# Patient Record
Sex: Male | Born: 2000 | Race: White | Hispanic: No | Marital: Single | Smoking: Never smoker
Health system: Southern US, Community
[De-identification: ages and names within clinical notes are randomized; demographics above are authoritative.]

---

## 2021-06-15 ENCOUNTER — Other Ambulatory Visit: Payer: Self-pay

## 2021-06-15 ENCOUNTER — Ambulatory Visit (INDEPENDENT_AMBULATORY_CARE_PROVIDER_SITE_OTHER): Payer: BC Managed Care – PPO | Admitting: Urology

## 2021-06-15 ENCOUNTER — Encounter: Payer: Self-pay | Admitting: Urology

## 2021-06-15 VITALS — BP 128/81 | HR 74 | Ht 73.0 in | Wt 155.0 lb

## 2021-06-15 DIAGNOSIS — N5089 Other specified disorders of the male genital organs: Secondary | ICD-10-CM

## 2021-06-15 NOTE — Progress Notes (Signed)
   06/15/2021 1:14 PM   Dylan Mcdaniel Mar 20, 2001 [id number]  Referring provider: No referring provider defined for this encounter.  Chief Complaint  Patient presents with   New Patient (Initial Visit)    Testicular cyst    HPI: Dylan Mcdaniel is a 20 y.o. male presents for evaluation of a scrotal mass.  Recently moved to the area from [place] and prior to leaving he palpated an abnormality of the left testis He was seen at urgent care and referred to a urologist in [place] A scrotal ultrasound was performed on 05/26/2021.  He showed me the report on his phone and the testes were normal in appearance and a small left spermatocele with this described He states he was not confident that this was the abnormality he was feeling and requested a second opinion No bothersome LUTS or scrotal pain   PMH: No past medical history on file.  Surgical History: None  Home Medications:  Allergies as of 06/15/2021   No Known Allergies      Medication List    as of June 15, 2021 11:59 PM   You have not been prescribed any medications.     Allergies: No Known Allergies  Family History: No family history on file.  Social History:  reports that he has never smoked. He has never used smokeless tobacco. He reports current alcohol use. He reports that he does not use drugs.   Physical Exam: BP 128/81   Pulse 74   Ht 6\' 1"  (1.854 m)   Wt 155 lb (70.3 kg)   BMI 20.45 kg/m   Constitutional:  Alert and oriented, No acute distress. HEENT: [place] AT, moist mucus membranes.  Trachea midline, no masses. Cardiovascular: No clubbing, cyanosis, or edema. Respiratory: Normal respiratory effort, no increased work of breathing. GU: Phallus without lesions, testes descended bilaterally, normal size.  On the superior aspect of the left testis is a smooth, flat area of increased firmness measuring ~ 3 mm.  Spermatic cord/epididymis palpably normal.  I do not appreciate a left spermatocele Skin: No  rashes, bruises or suspicious lesions. Neurologic: Grossly intact, no focal deficits, moving all 4 extremities. Psychiatric: Normal mood and affect.   Assessment & Plan:    1.  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

## 2021-06-21 ENCOUNTER — Ambulatory Visit
Admission: RE | Admit: 2021-06-21 | Discharge: 2021-06-21 | Disposition: A | Discharge status: Home / Self Care | Payer: BC Managed Care – PPO | Source: Ambulatory Visit | Attending: Urology | Admitting: Urology

## 2021-06-21 ENCOUNTER — Other Ambulatory Visit: Payer: Self-pay

## 2021-06-21 DIAGNOSIS — N5089 Other specified disorders of the male genital organs: Secondary | ICD-10-CM | POA: Insufficient documentation

## 2021-06-27 ENCOUNTER — Telehealth: Payer: Self-pay | Admitting: *Deleted

## 2021-06-27 NOTE — Telephone Encounter (Signed)
Notified patient as instructed, patient pleased. Discussed follow-up appointments, patient agrees  

## 2021-06-27 NOTE — Telephone Encounter (Signed)
-----   Message from Riki Altes, MD sent at 06/27/2021  7:32 AM EDT ----- Scrotal ultrasound showed no significant changes from his prior ultrasound that was done out of town.  They are calling the area of abnormality and epididymal cyst however on exam the palpable abnormality is on the testis.  Since no abnormalities of the testis noted this is benign.  I would recommend a follow-up exam in 2-3 months

## 2021-09-28 ENCOUNTER — Ambulatory Visit: Payer: BC Managed Care – PPO | Admitting: Urology

## 2021-09-28 ENCOUNTER — Encounter: Payer: Self-pay | Admitting: Urology

## 2022-08-05 IMAGING — US US SCROTUM W/ DOPPLER COMPLETE
1 series · 14 of 25 positions shown · non-contrast
Comparison: None.

CLINICAL DATA: Palpable abnormality over the left scrotum 2 months.

EXAM:
SCROTAL ULTRASOUND
DOPPLER ULTRASOUND OF THE TESTICLES
TECHNIQUE: Complete ultrasound examination of the testicles, epididymis, and
other scrotal structures was performed. Color and spectral Doppler
ultrasound were also utilized to evaluate blood flow to the
testicles.

[Series 1: us scrotum w/ doppler complete · 0.07mm/px · 14 of 68 slices shown]
[im 1/68]
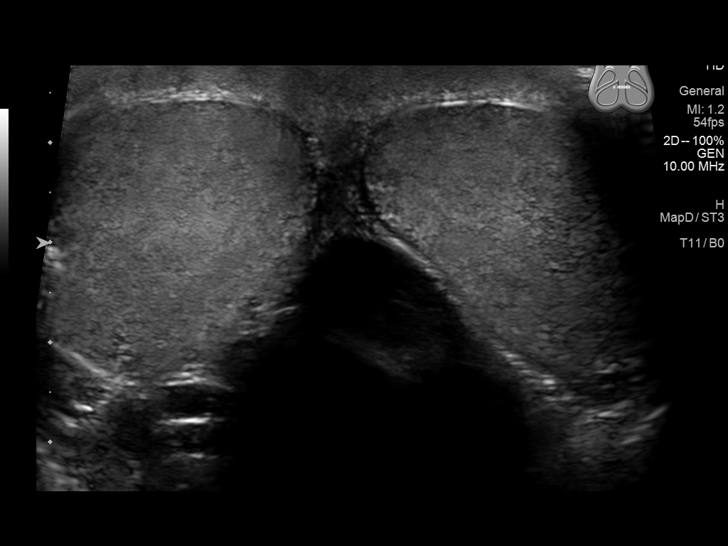
[im 6/68]
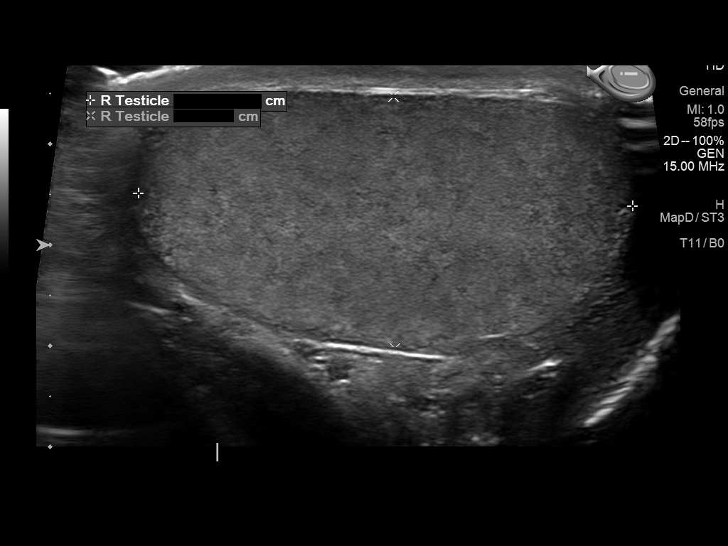
[im 12/68]
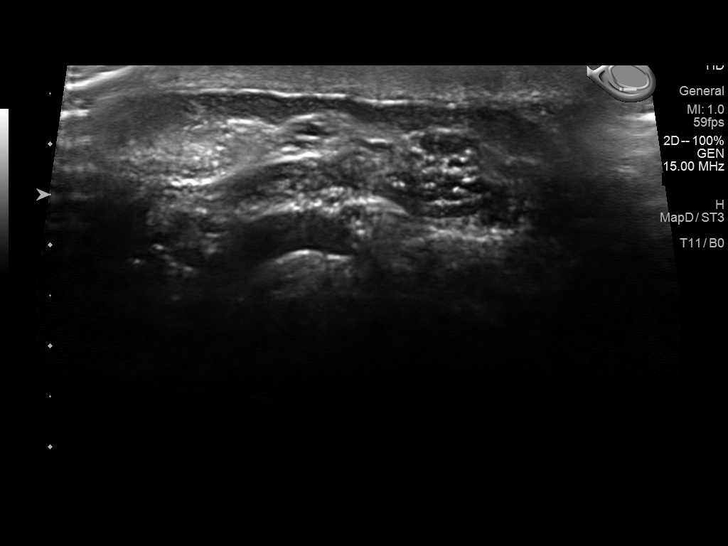
[im 17/68]
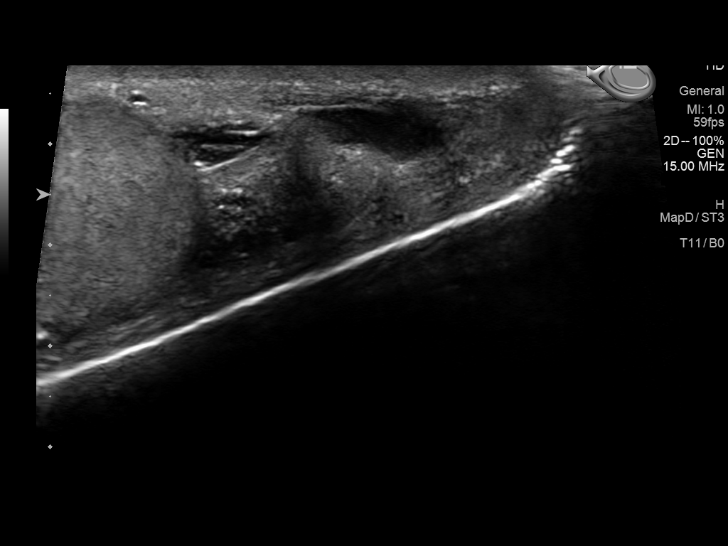
[im 23/68]
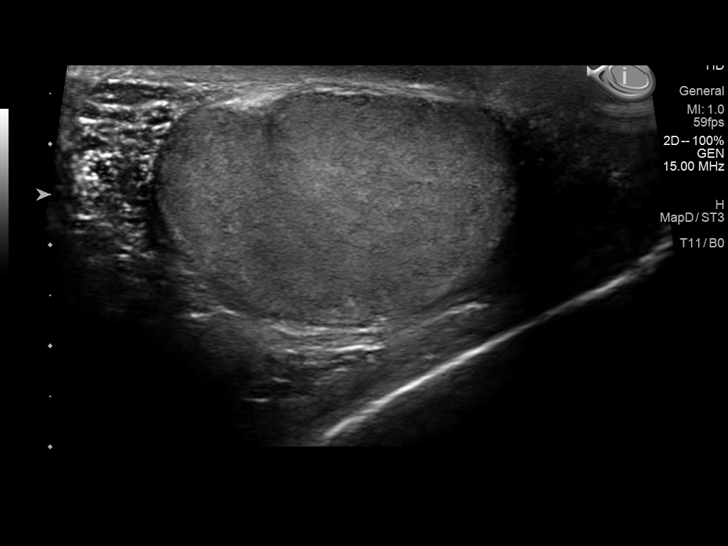
[im 26/68]
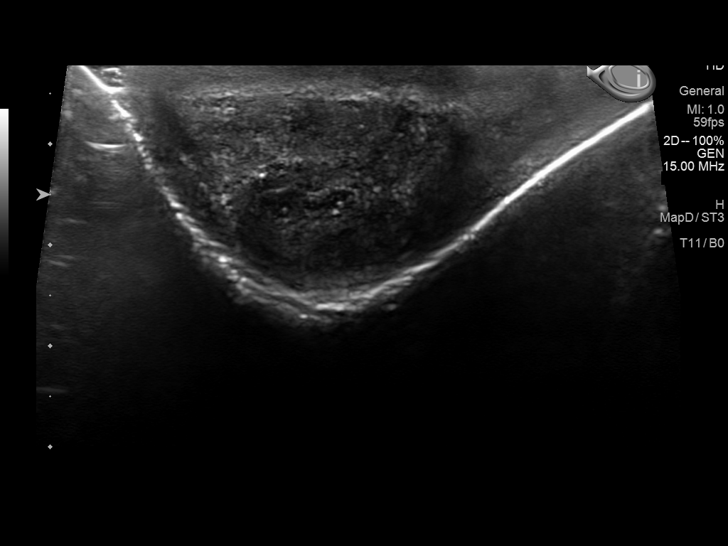
[im 31/68]
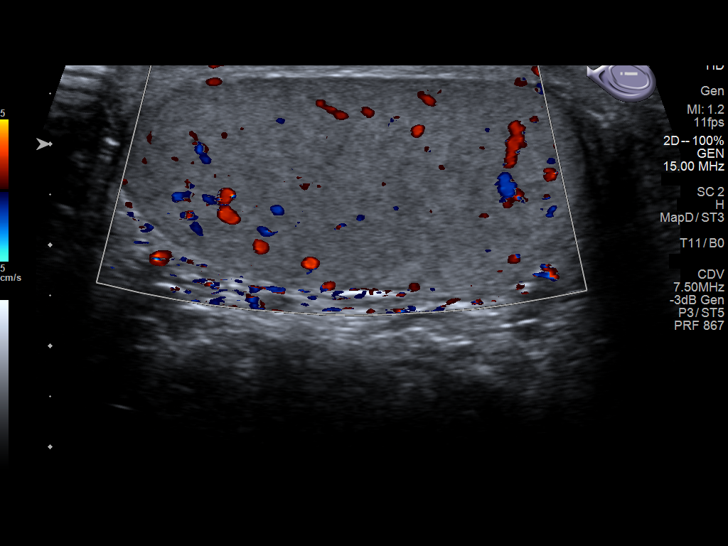
[im 37/68]
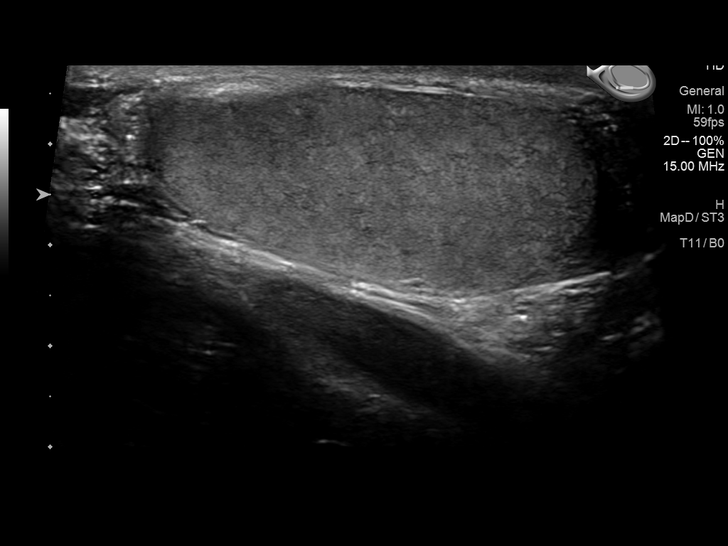
[im 42/68]
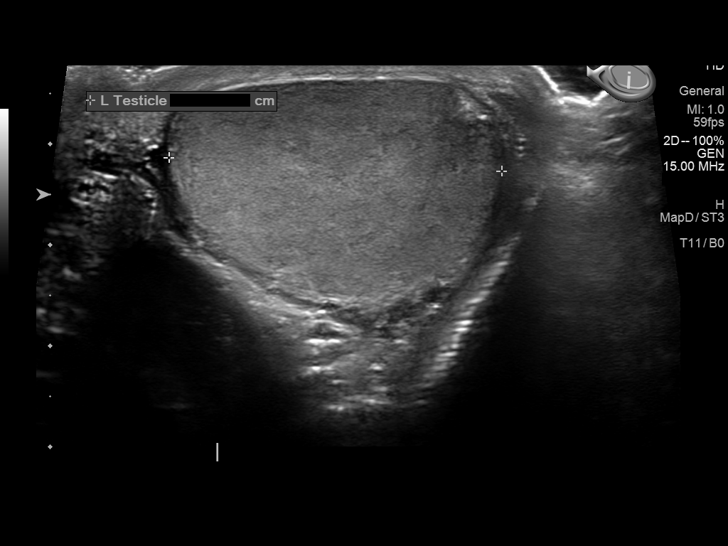
[im 45/68]
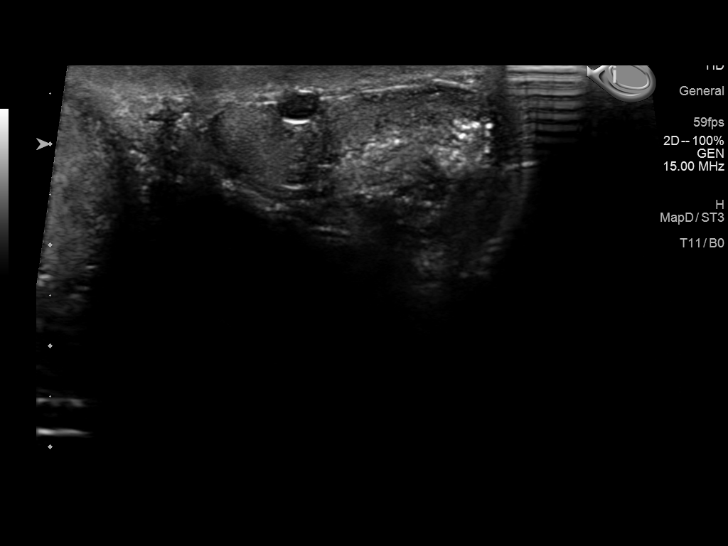
[im 51/68]
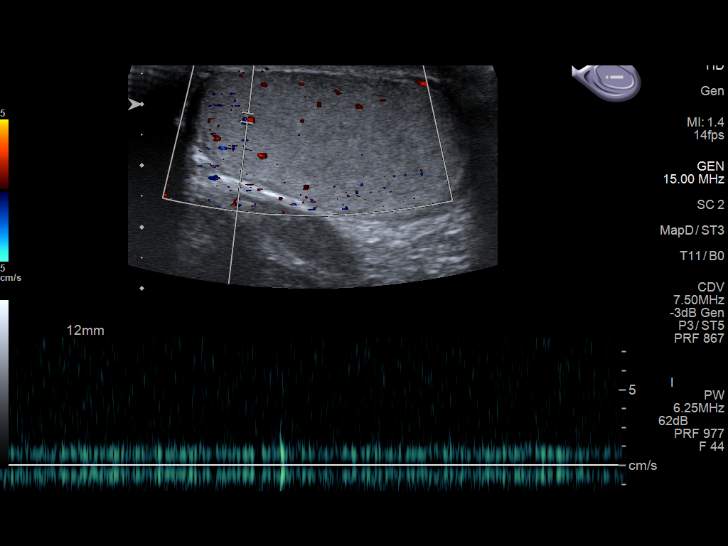
[im 56/68]
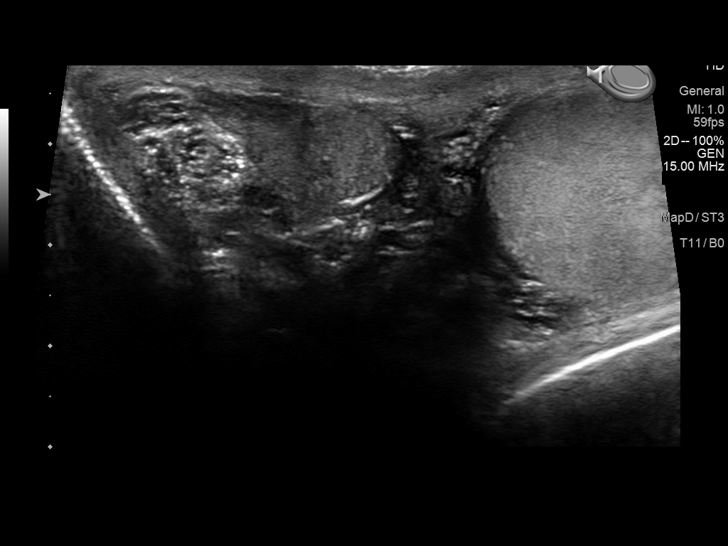
[im 62/68]
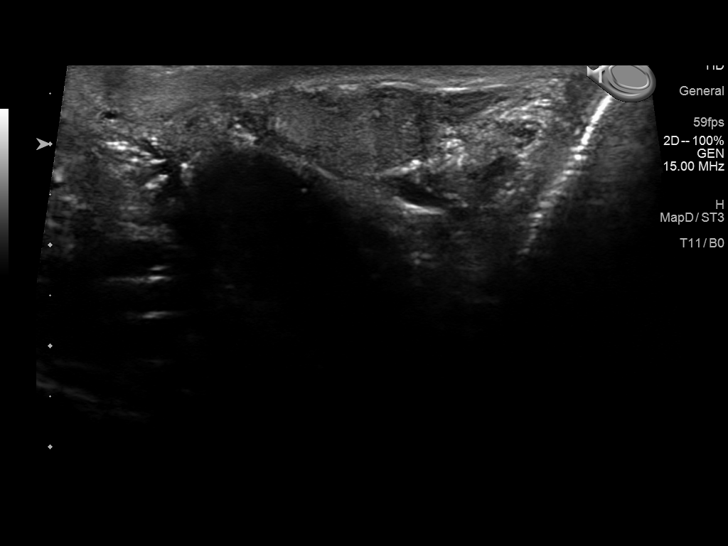
[im 68/68]
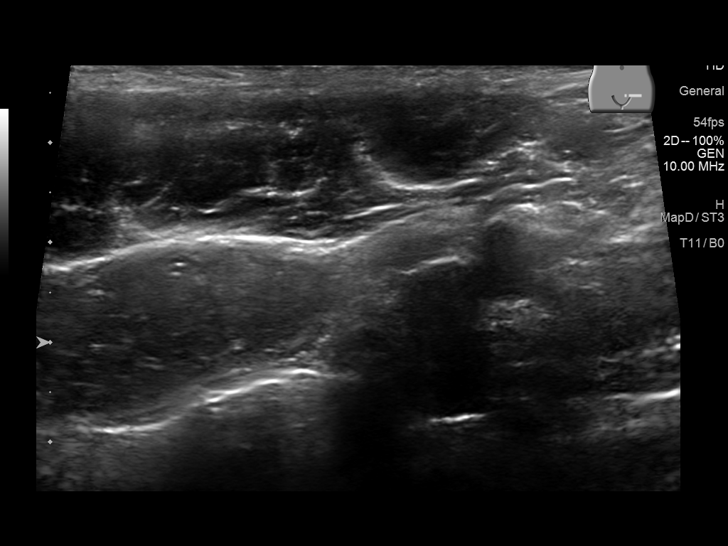

[14 of 25 positions shown; findings below may reference images not displayed]

FINDINGS: Right testicle

Measurements: 4.9 x 2.5 x 3.3 cm. No mass or microlithiasis
visualized.

Left testicle

Measurements: 4.7 x 2.1 x 3.3 cm. No mass or microlithiasis
visualized.

Right epididymis:  Normal in size and appearance.

Left epididymis: 5 mm cyst over the head of the left epididymis
corresponding to patient's palpable abnormality.

Hydrocele:  Tiny amount of bilateral scrotal fluid.

Varicocele:  None visualized.

Pulsed Doppler interrogation of both testes demonstrates normal low
resistance arterial and venous waveforms bilaterally.

No evidence of inguinal hernia.
IMPRESSION: 1.  Normal symmetric testicles with normal vascular flow.

2. 5 mm cyst over the head of the left epididymis correlating to
patient's palpable abnormality.
